# Patient Record
Sex: Female | Born: 1975
Health system: Southern US, Community
[De-identification: ages and names within clinical notes are randomized; demographics above are authoritative.]

## PROBLEM LIST (undated history)

## (undated) ENCOUNTER — Ambulatory Visit: Discharge status: Absent without leave | Payer: BLUE CROSS/BLUE SHIELD

## (undated) DIAGNOSIS — R519 Headache, unspecified: Secondary | ICD-10-CM

## (undated) DIAGNOSIS — Z8489 Family history of other specified conditions: Secondary | ICD-10-CM

## (undated) DIAGNOSIS — R51 Headache: Secondary | ICD-10-CM

## (undated) DIAGNOSIS — R112 Nausea with vomiting, unspecified: Secondary | ICD-10-CM

## (undated) DIAGNOSIS — D649 Anemia, unspecified: Secondary | ICD-10-CM

## (undated) DIAGNOSIS — Z9889 Other specified postprocedural states: Secondary | ICD-10-CM

## (undated) HISTORY — PX: ESSURE TUBAL LIGATION: SUR464

---

## 2001-09-03 ENCOUNTER — Observation Stay (HOSPITAL_COMMUNITY): Admission: AD | Admit: 2001-09-03 | Discharge: 2001-09-03 | Payer: Self-pay | Admitting: Obstetrics & Gynecology

## 2002-04-02 ENCOUNTER — Inpatient Hospital Stay (HOSPITAL_COMMUNITY): Admission: AD | Admit: 2002-04-02 | Discharge: 2002-04-04 | Payer: Self-pay | Admitting: Obstetrics and Gynecology

## 2002-06-07 ENCOUNTER — Ambulatory Visit (HOSPITAL_COMMUNITY): Admission: RE | Admit: 2002-06-07 | Discharge: 2002-06-07 | Payer: Self-pay | Admitting: *Deleted

## 2002-07-25 ENCOUNTER — Ambulatory Visit (HOSPITAL_COMMUNITY): Admission: RE | Admit: 2002-07-25 | Discharge: 2002-07-25 | Payer: Self-pay | Admitting: *Deleted

## 2003-01-10 ENCOUNTER — Encounter: Payer: Self-pay | Admitting: *Deleted

## 2003-01-10 ENCOUNTER — Ambulatory Visit (HOSPITAL_COMMUNITY): Admission: RE | Admit: 2003-01-10 | Discharge: 2003-01-10 | Payer: Self-pay | Admitting: Obstetrics and Gynecology

## 2005-06-03 ENCOUNTER — Emergency Department (HOSPITAL_COMMUNITY): Admission: EM | Admit: 2005-06-03 | Discharge: 2005-06-03 | Payer: Self-pay | Admitting: Emergency Medicine

## 2010-11-07 ENCOUNTER — Encounter: Payer: Self-pay | Admitting: Obstetrics and Gynecology

## 2011-03-04 NOTE — Op Note (Signed)
NAME:  Virginia Juarez, RABOLD                      ACCOUNT NO.:  [id number]   MEDICAL RECORD NO.:  [id number]                   PATIENT TYPE:  AMB   LOCATION:  SDC                                  FACILITY:  [facility]   PHYSICIAN:  [place] B. Earlene Plater, M.D.               DATE OF BIRTH:  1976/08/05   DATE OF PROCEDURE:  07/25/2002  DATE OF DISCHARGE:                                 OPERATIVE REPORT   PREOPERATIVE DIAGNOSES:  Desires tubal sterilization.   POSTOPERATIVE DIAGNOSES:  Desires tubal sterilization.   PROCEDURE:  Essure tubal sterilization.   SURGEON:  Chester Holstein. Earlene Plater, M.D.   ANESTHESIA:  General.   FINDINGS:  Atrophic endometrium, normal appearing tubal ostia.  After  placement of the Essure device and four coils visualized on the right and  three coils on the left this is considered optimal placement.   COMPLICATIONS:  None.   FLUID DEFICIT:  100 cc of normal saline.   INDICATIONS:  The patient desires tubal sterilization.  Previous  unsuccessful attempt with the Essure system due to poor visualization of  tubal ostia.  Therefore, patient was prepared today to undergo laparoscopic  tubal if we were unable to perform the procedure by the hysteroscopic route.  Therefore, she was placed under general anesthesia rather than a MAC.   PROCEDURE:  The patient was taken to the operating room and general  anesthesia obtained.  She was placed in the ski position and prepped and  draped in the standard fashion.  The bladder was emptied with rubber  catheter.  Speculum was inserted.  Cervix grasped with a tooth tenaculum and  the 5 mm 30 degree hysteroscope inserted after being flushed with saline.  Good uterine distention was noted and there was excellent suppression of the  endometrium.  Both tubal ostia were easily visualized.  The left tubal ostia  was first approached.  The Essure device was inserted, the guide wires  placed into the tubal ostia to the black marker per protocol.   The device  was then deployed in standard fashion.  Three coils were noted to emanate  from the tubal ostia after placement.  This was repeated on the opposite  side in the exact same manner.  Four coils were visualized after placement.  Again, this is considered optimal placement.   The patient tolerated procedure well and there were no complications.  She  was taken to the recovery room awake, alert, and in stable condition.   The patient is instructed to continue her Depo Provera for contraception  until the three month FDA mandated postoperative HSG.                                               Gerri Spore B. Earlene Plater, M.D.    WBD/MEDQ  D:  07/25/2002  T:  07/25/2002  Job:  [id number]

## 2011-03-04 NOTE — H&P (Signed)
   NAME:  Virginia Juarez, Virginia Juarez                      ACCOUNT NO.:  [id number]   MEDICAL RECORD NO.:  [id number]                   PATIENT TYPE:  AMB   LOCATION:  SDC                                  [facility]  [facility]   PHYSICIAN:  Gerri Spore B. Earlene Plater, M.D.               DATE OF BIRTH:  04-02-1976   DATE OF ADMISSION:  06/07/2002  DATE OF DISCHARGE:                                HISTORY & PHYSICAL   PREOPERATIVE DIAGNOSIS:  Desires permanent sterilization by Essure system.   POSTOPERATIVE DIAGNOSIS:  Desires permanent sterilization by Essure system.   HISTORY OF PRESENT ILLNESS:  The patient is a 35 year old African-American  female, gravida 3, para 2, A1, presents today for permanent tubal  sterilization.  Has considered alternative methods such as laparoscopic  tubal, IUD, Depo-Provera, or other non-permanent means.   PAST MEDICAL HISTORY:  Recurrent BEV.   PAST SURGICAL HISTORY:  None.   MEDICATIONS:  Depo-Provera.   ALLERGIES:  No known drug allergies.   SOCIAL HISTORY:  Occasional alcohol, no tobacco or drugs.   FAMILY HISTORY:  Hypertension.   REVIEW OF SYMPTOMS:  Otherwise noncontributory.   PHYSICAL EXAMINATION:  VITAL SIGNS:  Blood pressure 110/68, pulse 70, weight  140.  GENERAL:  Alert and oriented in no acute distress.  SKIN:  Warm and dry, no lesions.  HEART:  Regular rate and rhythm.  LUNGS:  Clear to auscultation.  ABDOMEN:  No evidence of hernia.  PELVIC:  Normal external genitalia.  Cervix normal.  Uterus is normal size,  nontender, no adnexal masses or tenderness.   ASSESSMENT:  Desires permanent sterilization with the Essure procedure.  Operation was discussed, including bleeding, uterine perforation, damage to  bowel, bladder, or surrounding organs.  Also discussed the 10 to 15% chance  of non-visualization or inability to cannulate both tubes.  Also discussed  the FDA mandated three month postoperative HSG to confirm tubal occlusion.  All questions answered,  the patient wishes to proceed.                                               Gerri Spore B. Earlene Plater, M.D.   WBD/MEDQ  D:  05/24/2002  T:  05/25/2002  Job:  [id number]

## 2011-03-04 NOTE — Op Note (Signed)
NAME:  Virginia Juarez, Virginia Juarez                      ACCOUNT NO.:  [id number]   MEDICAL RECORD NO.:  [id number]                   PATIENT TYPE:  AMB   LOCATION:  SDC                                  [facility]   PHYSICIAN:  Gerri Spore B. Earlene Plater, M.D.               DATE OF BIRTH:  06-07-1976   DATE OF PROCEDURE:  06/07/2002  DATE OF DISCHARGE:                                 OPERATIVE REPORT   PREOPERATIVE DIAGNOSIS:  Desires tubal sterilization by the Essure  procedure.   POSTOPERATIVE DIAGNOSIS:  Desires tubal sterilization by the Essure  procedure.   PROCEDURE:  Diagnostic hysteroscopy and attempted Essure insertion  (unsuccessful due to poor visualization of tubal ostia from excessive  endometrial tissue).   SURGEON:  Chester Holstein. Earlene Plater, M.D.   ANESTHESIA:  MAC and 10 cc of 0.75% Nesacaine paracervical block.   FINDINGS:  Shaggy endometrium with limited visualization of tubal ostia.   COMPLICATIONS:  None.   DISTENTION:  Medium saline deficit 709 cc.   INDICATIONS FOR PROCEDURE:  Desires permanent sterilization by the Essure  procedure.   PROCEDURE:  The patient was taken to the operating room and MAC anesthesia  obtained.  She was placed in the ski position and prepped and draped in a  standard fashion.  The bladder was emptied with a red rubber catheter.  The  patient was examined under anesthesia and found to have an anteverted,  normal size uterus.  No adnexal masses palpable.   The speculum was inserted, paracervical block placed, and the anterior lip  of cervix grasped with a single-tooth tenaculum.  A 5-mm hysteroscope was  inserted after being flushed and the cavity was distended was saline.  Shaggy endometrium was noted.  There was limited visualization of the tubal  ostia.  Multiple attempts were made to visualize the right tubal ostia as  the more difficult side.  It could be intermittently visualized; however,  when the working channel was open to insert the  Essure device, that slight  amount of loss of pressure would make visualization not possible.  As this  was the case, the device could not be safely inserted and the procedure was  abandoned.   The scope was removed, the tenaculum removed, and the cervix inspected.  It  was hemostatic.   The patient was taken to the recovery room awake but in stable condition.  The patient will have her postop visit in four weeks and we will discuss the  next step as far as sterilization.  She will have the option of a repeat  attempt.  With the Essure with better suppression of the endometrium, I feel  that she would be a good candidate.  Gerri Spore B. Earlene Plater, M.D.    WBD/MEDQ  D:  06/07/2002  T:  06/08/2002  Job:  [id number]

## 2011-03-04 NOTE — H&P (Signed)
   NAME:  Virginia Juarez, Virginia Juarez                      ACCOUNT NO.:  [id number]   MEDICAL RECORD NO.:  [id number]                   PATIENT TYPE:  AMB   LOCATION:  SDC                                  [facility]   PHYSICIAN:  [place] B. Earlene Plater, M.D.               DATE OF BIRTH:  [birth year]   DATE OF ADMISSION:  DATE OF DISCHARGE:                                HISTORY & PHYSICAL   PREOPERATIVE DIAGNOSIS:  Desires permanent sterilization.   POSTOPERATIVE DIAGNOSIS:  Desires permanent sterilization.   HISTORY OF PRESENT ILLNESS:  This is a 35 year old African-American female,  gravida 3, para 2, A1, presents today for definitive tubal sterilization.  She is interested in the Essure procedure.  We have undergone this once  before.  __________ , however, were unable to visualize the tubal osteal due  to inadequate suppression of the endometrium.  She presents today for repeat  procedure.  If there is any difficulty in visualization or canalization of  the tubes, we intend to proceed with a laparoscopic tubal.   PAST MEDICAL HISTORY:  Recurrent bacterial vaginosis.   PAST SURGICAL HISTORY:  None.   MEDICATIONS:  Depo-Provera.   ALLERGIES:  None.   SOCIAL HISTORY:  No alcohol, tobacco, or drugs.   FAMILY HISTORY:  Hypertension.   REVIEW OF SYSTEMS:  Otherwise negative.   PHYSICAL EXAMINATION:  VITAL SIGNS:  Blood pressure 110/68, pulse 70, weight  140.  GENERAL:  Oriented, no acute distress.  SKIN:  Warm and dry without lesions.  HEART:  Regular rate and rhythm.  LUNGS:  Clear to auscultation.  ABDOMEN:  No __________ without hernia.  PELVIC:  Normal external genitalia.  Vagina and cervix normal.  Bimanual  exam:  Normal sized uterus.  No adnexal mass or tenderness.   ASSESSMENT:  Desires permanent tubal sterilization.    PLAN:  Attempt at repeat Essure tubal sterilization.  If unable to complete,  will proceed with laparoscopic tubal.  Failure rate, ectopic risks, and  operative risks including infection, bleeding, damage to bowel or bladder,  or other organs, have all been discussed.  All questions were answered and  the patient wishes to proceed.                                                 Gerri Spore B. Earlene Plater, M.D.    WBD/MEDQ  D:  07/22/2002  T:  07/22/2002  Job:  [id number]

## 2012-01-03 ENCOUNTER — Emergency Department (HOSPITAL_COMMUNITY)
Admission: EM | Admit: 2012-01-03 | Discharge: 2012-01-04 | Disposition: A | Discharge status: Home / Self Care | Payer: BC Managed Care – PPO

## 2012-02-09 ENCOUNTER — Other Ambulatory Visit (HOSPITAL_COMMUNITY): Payer: Self-pay | Admitting: Nurse Practitioner

## 2012-02-09 DIAGNOSIS — Z139 Encounter for screening, unspecified: Secondary | ICD-10-CM

## 2012-02-10 ENCOUNTER — Ambulatory Visit (HOSPITAL_COMMUNITY)
Admission: RE | Admit: 2012-02-10 | Discharge: 2012-02-10 | Disposition: A | Discharge status: Home / Self Care | Payer: BC Managed Care – PPO | Source: Ambulatory Visit | Attending: Nurse Practitioner | Admitting: Nurse Practitioner

## 2012-02-10 DIAGNOSIS — Z1231 Encounter for screening mammogram for malignant neoplasm of breast: Secondary | ICD-10-CM | POA: Insufficient documentation

## 2012-02-10 DIAGNOSIS — Z139 Encounter for screening, unspecified: Secondary | ICD-10-CM

## 2014-08-13 ENCOUNTER — Other Ambulatory Visit (HOSPITAL_COMMUNITY): Payer: Self-pay | Admitting: Obstetrics and Gynecology

## 2014-08-13 DIAGNOSIS — Z1231 Encounter for screening mammogram for malignant neoplasm of breast: Secondary | ICD-10-CM

## 2014-09-05 ENCOUNTER — Ambulatory Visit (HOSPITAL_COMMUNITY): Payer: BC Managed Care – PPO | Attending: Obstetrics and Gynecology

## 2014-09-22 ENCOUNTER — Ambulatory Visit (HOSPITAL_COMMUNITY)
Admission: RE | Admit: 2014-09-22 | Discharge: 2014-09-22 | Disposition: A | Discharge status: Home / Self Care | Payer: BC Managed Care – PPO | Source: Ambulatory Visit | Attending: Obstetrics and Gynecology | Admitting: Obstetrics and Gynecology

## 2014-09-22 DIAGNOSIS — Z1231 Encounter for screening mammogram for malignant neoplasm of breast: Secondary | ICD-10-CM | POA: Insufficient documentation

## 2014-09-24 ENCOUNTER — Other Ambulatory Visit: Payer: Self-pay | Admitting: Obstetrics and Gynecology

## 2014-09-24 DIAGNOSIS — R928 Other abnormal and inconclusive findings on diagnostic imaging of breast: Secondary | ICD-10-CM

## 2014-10-07 ENCOUNTER — Ambulatory Visit (HOSPITAL_COMMUNITY)
Admission: RE | Admit: 2014-10-07 | Discharge: 2014-10-07 | Disposition: A | Discharge status: Home / Self Care | Payer: BC Managed Care – PPO | Source: Ambulatory Visit | Attending: Obstetrics and Gynecology | Admitting: Obstetrics and Gynecology

## 2014-10-07 DIAGNOSIS — Z1231 Encounter for screening mammogram for malignant neoplasm of breast: Secondary | ICD-10-CM | POA: Diagnosis not present

## 2014-10-07 DIAGNOSIS — R928 Other abnormal and inconclusive findings on diagnostic imaging of breast: Secondary | ICD-10-CM

## 2015-09-15 ENCOUNTER — Other Ambulatory Visit (HOSPITAL_COMMUNITY): Payer: Self-pay | Admitting: Nurse Practitioner

## 2015-09-15 DIAGNOSIS — Z1231 Encounter for screening mammogram for malignant neoplasm of breast: Secondary | ICD-10-CM

## 2015-10-14 ENCOUNTER — Ambulatory Visit (HOSPITAL_COMMUNITY)
Admission: RE | Admit: 2015-10-14 | Discharge: 2015-10-14 | Disposition: A | Discharge status: Home / Self Care | Payer: BLUE CROSS/BLUE SHIELD | Source: Ambulatory Visit | Attending: Nurse Practitioner | Admitting: Nurse Practitioner

## 2015-10-14 DIAGNOSIS — Z1231 Encounter for screening mammogram for malignant neoplasm of breast: Secondary | ICD-10-CM | POA: Diagnosis present

## 2016-07-14 ENCOUNTER — Other Ambulatory Visit: Payer: Self-pay | Admitting: Obstetrics and Gynecology

## 2016-07-28 NOTE — Patient Instructions (Addendum)
Your procedure is scheduled on:  Wednesday, Oct. 18, 2017  Enter through the [employer] [facility] at:  7:00 AM  Pick up the phone at the desk and dial [phone].  Call this number if you have problems the morning of surgery: [phone].  Remember: Do NOT eat food or drink after:  Midnight Tuesday  Take these medicines the morning of surgery with a SIP OF WATER:  None  Do NOT wear jewelry (body piercing), metal hair clips/bobby pins, make-up, or nail polish. Do NOT wear lotions, powders, or perfumes.  You may wear deodorant. Do NOT shave for 48 hours prior to surgery. Do NOT bring valuables to the hospital. Contacts, dentures, or bridgework may not be worn into surgery.  Have a responsible adult drive you home and stay with you for 24 hours after your procedure

## 2016-08-01 ENCOUNTER — Encounter (HOSPITAL_COMMUNITY)
Admission: RE | Admit: 2016-08-01 | Discharge: 2016-08-01 | Disposition: A | Discharge status: Home / Self Care | Payer: BLUE CROSS/BLUE SHIELD | Source: Ambulatory Visit | Attending: Obstetrics and Gynecology | Admitting: Obstetrics and Gynecology

## 2016-08-01 ENCOUNTER — Encounter (HOSPITAL_COMMUNITY): Payer: Self-pay

## 2016-08-01 DIAGNOSIS — N920 Excessive and frequent menstruation with regular cycle: Secondary | ICD-10-CM | POA: Diagnosis present

## 2016-08-01 DIAGNOSIS — N840 Polyp of corpus uteri: Secondary | ICD-10-CM | POA: Diagnosis not present

## 2016-08-01 HISTORY — DX: Anemia, unspecified: D64.9

## 2016-08-01 HISTORY — DX: Headache, unspecified: R51.9

## 2016-08-01 HISTORY — DX: Other specified postprocedural states: Z98.890

## 2016-08-01 HISTORY — DX: Nausea with vomiting, unspecified: R11.2

## 2016-08-01 HISTORY — DX: Headache: R51

## 2016-08-01 LAB — CBC
HCT: 32.3 % — ABNORMAL LOW (ref 36.0–46.0)
Hemoglobin: 11.6 g/dL — ABNORMAL LOW (ref 12.0–15.0)
MCH: 31.8 pg (ref 26.0–34.0)
MCHC: 35.9 g/dL (ref 30.0–36.0)
MCV: 88.5 fL (ref 78.0–100.0)
Platelets: 314 10*3/uL (ref 150–400)
RBC: 3.65 MIL/uL — ABNORMAL LOW (ref 3.87–5.11)
RDW: 13.7 % (ref 11.5–15.5)
WBC: 5.3 10*3/uL (ref 4.0–10.5)

## 2016-08-03 ENCOUNTER — Ambulatory Visit (HOSPITAL_COMMUNITY)
Admission: RE | Admit: 2016-08-03 | Discharge: 2016-08-03 | Disposition: A | Discharge status: Home / Self Care | Payer: BLUE CROSS/BLUE SHIELD | Source: Ambulatory Visit | Attending: Obstetrics and Gynecology | Admitting: Obstetrics and Gynecology

## 2016-08-03 ENCOUNTER — Ambulatory Visit (HOSPITAL_COMMUNITY): Payer: BLUE CROSS/BLUE SHIELD | Admitting: Anesthesiology

## 2016-08-03 ENCOUNTER — Encounter (HOSPITAL_COMMUNITY)
Admission: RE | Disposition: A | Discharge status: Home / Self Care | Payer: Self-pay | Source: Ambulatory Visit | Attending: Obstetrics and Gynecology

## 2016-08-03 ENCOUNTER — Encounter (HOSPITAL_COMMUNITY): Payer: Self-pay | Admitting: Emergency Medicine

## 2016-08-03 DIAGNOSIS — N840 Polyp of corpus uteri: Secondary | ICD-10-CM | POA: Insufficient documentation

## 2016-08-03 DIAGNOSIS — N920 Excessive and frequent menstruation with regular cycle: Secondary | ICD-10-CM

## 2016-08-03 HISTORY — PX: DILATATION & CURETTAGE/HYSTEROSCOPY WITH MYOSURE: SHX6511

## 2016-08-03 HISTORY — DX: Family history of other specified conditions: Z84.89

## 2016-08-03 LAB — PREGNANCY, URINE: Preg Test, Ur: NEGATIVE

## 2016-08-03 SURGERY — DILATATION & CURETTAGE/HYSTEROSCOPY WITH MYOSURE
Anesthesia: General | Site: Uterus

## 2016-08-03 MED ORDER — MIDAZOLAM HCL 2 MG/2ML IJ SOLN
INTRAMUSCULAR | Status: AC
  Filled 2016-08-03: qty 2

## 2016-08-03 MED ORDER — DEXAMETHASONE SODIUM PHOSPHATE 10 MG/ML IJ SOLN
INTRAMUSCULAR | Status: AC
  Filled 2016-08-03: qty 1

## 2016-08-03 MED ORDER — LIDOCAINE HCL (CARDIAC) 20 MG/ML IV SOLN
INTRAVENOUS | Status: AC
  Filled 2016-08-03: qty 5

## 2016-08-03 MED ORDER — FENTANYL CITRATE (PF) 100 MCG/2ML IJ SOLN
INTRAMUSCULAR | Status: AC
  Filled 2016-08-03: qty 2

## 2016-08-03 MED ORDER — SCOPOLAMINE 1 MG/3DAYS TD PT72
MEDICATED_PATCH | TRANSDERMAL | Status: AC
  Administered 2016-08-03: 1.5 mg via TRANSDERMAL
  Filled 2016-08-03: qty 1

## 2016-08-03 MED ORDER — IBUPROFEN 800 MG PO TABS: 800.0000 mg | ORAL_TABLET | Freq: Three times a day (TID) | ORAL | 0 refills | Status: DC | PRN

## 2016-08-03 MED ORDER — SCOPOLAMINE 1 MG/3DAYS TD PT72
1.0000 | MEDICATED_PATCH | Freq: Once | TRANSDERMAL | Status: DC
  Administered 2016-08-03: 1.5 mg via TRANSDERMAL

## 2016-08-03 MED ORDER — DEXAMETHASONE SODIUM PHOSPHATE 4 MG/ML IJ SOLN
INTRAMUSCULAR | Status: DC | PRN
  Administered 2016-08-03: 10 mg via INTRAVENOUS

## 2016-08-03 MED ORDER — ONDANSETRON HCL 4 MG/2ML IJ SOLN
INTRAMUSCULAR | Status: AC
  Filled 2016-08-03: qty 2

## 2016-08-03 MED ORDER — KETOROLAC TROMETHAMINE 60 MG/2ML IM SOLN
INTRAMUSCULAR | Status: DC | PRN
  Administered 2016-08-03: 30 mg via INTRAMUSCULAR

## 2016-08-03 MED ORDER — FENTANYL CITRATE (PF) 100 MCG/2ML IJ SOLN: 25.0000 ug | INTRAMUSCULAR | Status: DC | PRN

## 2016-08-03 MED ORDER — SCOPOLAMINE 1 MG/3DAYS TD PT72
MEDICATED_PATCH | TRANSDERMAL | Status: AC
  Filled 2016-08-03: qty 1

## 2016-08-03 MED ORDER — LIDOCAINE HCL (CARDIAC) 20 MG/ML IV SOLN
INTRAVENOUS | Status: DC | PRN
  Administered 2016-08-03: 100 mg via INTRAVENOUS

## 2016-08-03 MED ORDER — LACTATED RINGERS IV SOLN
INTRAVENOUS | Status: DC
  Administered 2016-08-03 (×2): via INTRAVENOUS

## 2016-08-03 MED ORDER — MIDAZOLAM HCL 5 MG/5ML IJ SOLN
INTRAMUSCULAR | Status: DC | PRN
  Administered 2016-08-03: 2 mg via INTRAVENOUS

## 2016-08-03 MED ORDER — MEPERIDINE HCL 25 MG/ML IJ SOLN: 6.2500 mg | INTRAMUSCULAR | Status: DC | PRN

## 2016-08-03 MED ORDER — PROPOFOL 10 MG/ML IV BOLUS
INTRAVENOUS | Status: AC
  Filled 2016-08-03: qty 20

## 2016-08-03 MED ORDER — METOCLOPRAMIDE HCL 5 MG/ML IJ SOLN: 10.0000 mg | Freq: Once | INTRAMUSCULAR | Status: DC | PRN

## 2016-08-03 MED ORDER — KETOROLAC TROMETHAMINE 30 MG/ML IJ SOLN
INTRAMUSCULAR | Status: AC
  Filled 2016-08-03: qty 2

## 2016-08-03 MED ORDER — PROPOFOL 10 MG/ML IV BOLUS
INTRAVENOUS | Status: DC | PRN
  Administered 2016-08-03: 200 mg via INTRAVENOUS

## 2016-08-03 MED ORDER — KETOROLAC TROMETHAMINE 30 MG/ML IJ SOLN
INTRAMUSCULAR | Status: DC | PRN
  Administered 2016-08-03: 30 mg via INTRAVENOUS

## 2016-08-03 MED ORDER — FENTANYL CITRATE (PF) 100 MCG/2ML IJ SOLN
INTRAMUSCULAR | Status: DC | PRN
  Administered 2016-08-03 (×2): 50 ug via INTRAVENOUS

## 2016-08-03 MED ORDER — ONDANSETRON HCL 4 MG/2ML IJ SOLN
INTRAMUSCULAR | Status: DC | PRN
  Administered 2016-08-03: 4 mg via INTRAVENOUS

## 2016-08-03 MED ORDER — LACTATED RINGERS IV SOLN: INTRAVENOUS | Status: DC

## 2016-08-03 SURGICAL SUPPLY — 20 items
CANISTER SUCT 3000ML (MISCELLANEOUS) ×2 IMPLANT
CATH ROBINSON RED A/P 16FR (CATHETERS) ×2 IMPLANT
CLOTH BEACON ORANGE TIMEOUT ST (SAFETY) ×2 IMPLANT
CONTAINER PREFILL 10% NBF 60ML (FORM) ×4 IMPLANT
DEVICE MYOSURE LITE (MISCELLANEOUS) ×1 IMPLANT
DEVICE MYOSURE REACH (MISCELLANEOUS) IMPLANT
ELECT REM PT RETURN 9FT ADLT (ELECTROSURGICAL) ×2
ELECTRODE REM PT RTRN 9FT ADLT (ELECTROSURGICAL) ×1 IMPLANT
FILTER ARTHROSCOPY CONVERTOR (FILTER) ×2 IMPLANT
GLOVE BIOGEL PI IND STRL 7.0 (GLOVE) ×2 IMPLANT
GLOVE BIOGEL PI INDICATOR 7.0 (GLOVE) ×2
GLOVE ECLIPSE 6.5 STRL STRAW (GLOVE) ×2 IMPLANT
GOWN STRL REUS W/TWL LRG LVL3 (GOWN DISPOSABLE) ×4 IMPLANT
PACK VAGINAL MINOR WOMEN LF (CUSTOM PROCEDURE TRAY) ×2 IMPLANT
PAD OB MATERNITY 4.3X12.25 (PERSONAL CARE ITEMS) ×2 IMPLANT
SEAL ROD LENS SCOPE MYOSURE (ABLATOR) ×2 IMPLANT
TOWEL OR 17X24 6PK STRL BLUE (TOWEL DISPOSABLE) ×4 IMPLANT
TUBING AQUILEX INFLOW (TUBING) ×2 IMPLANT
TUBING AQUILEX OUTFLOW (TUBING) ×2 IMPLANT
WATER STERILE IRR 1000ML POUR (IV SOLUTION) ×2 IMPLANT

## 2016-08-03 NOTE — Discharge Instructions (Signed)
CALL  IF TEMP>100.4, NOTHING PER VAGINA X 1 WK, CALL IF SOAKING A MAXI  PAD EVERY HOUR OR MORE FREQUENTLY  DISCHARGE INSTRUCTIONS: HYSTEROSCOPY The following instructions have been prepared to help you care for yourself upon your return home.  May Remove Scop patch on or before Saturday 08/06/16. Wash your hands with soap and water after any contact with the patch.  May take Ibuprofen after 2:30pm 08/03/16.  May take stool softner while taking narcotic pain medication to prevent constipation.  Drink plenty of water.  Personal hygiene:  Use sanitary pads for vaginal drainage, not tampons.  Shower the day after your procedure.  NO tub baths, pools or Jacuzzis for 2-3 weeks.  Wipe front to back after using the bathroom.  Activity and limitations:  Do NOT drive or operate any equipment for 24 hours. The effects of anesthesia are still present and drowsiness may result.  Do NOT rest in bed all day.  Walking is encouraged.  Walk up and down stairs slowly.  You may resume your normal activity in one to two days or as indicated by your physician. Sexual activity: NO intercourse for at least 1 week after the procedure  Diet: Eat a light meal as desired this evening. You may resume your usual diet tomorrow.  Return to Work: You may resume your work activities in one to two days or as indicated by Marine scientist.  What to expect after your surgery: Expect to have vaginal bleeding/discharge for 2-3 days and spotting for up to 10 days. It is not unusual to have soreness for up to 1-2 weeks. You may have a slight burning sensation when you urinate for the first day. Mild cramps may continue for a couple of days. You may have a regular period in 2-6 weeks.  Call your doctor for any of the following:  Excessive vaginal bleeding or clotting, saturating and changing one pad every hour.  Inability to urinate 6 hours after discharge from hospital.  Pain not relieved by pain medication.   Fever of 100.4 F or greater.  Unusual vaginal discharge or odor.  Return to office _________________Call for an appointment ___________________ Patients signature: ______________________ Nurses signature ________________________  [facility] [phone]

## 2016-08-03 NOTE — Anesthesia Postprocedure Evaluation (Addendum)
Anesthesia Post Note  Patient: Virginia Juarez  Procedure(s) Performed: Procedure(s) (LRB): DILATATION & CURETTAGE/HYSTEROSCOPY WITH MYOSURE (N/A)  Patient location during evaluation: PACU Anesthesia Type: General Level of consciousness: awake Pain management: pain level controlled Vital Signs Assessment: post-procedure vital signs reviewed and stable Cardiovascular status: stable Postop Assessment: no signs of nausea or vomiting Anesthetic complications: no     Last Vitals:  Vitals:   08/03/16 0915 08/03/16 0930  BP: 107/72 110/77  Pulse: (!) 55 (!) 59  Resp: 13 14  Temp:      Last Pain:  Vitals:   08/03/16 0915  TempSrc:   PainSc: 2    Pain Goal: Patients Stated Pain Goal: 3 (08/03/16 0915)               [place]

## 2016-08-03 NOTE — Transfer of Care (Signed)
Immediate Anesthesia Transfer of Care Note  Patient: Virginia Juarez  Procedure(s) Performed: Procedure(s): DILATATION & CURETTAGE/HYSTEROSCOPY WITH MYOSURE (N/A)  Patient Location: PACU  Anesthesia Type:General  Level of Consciousness: sedated  Airway & Oxygen Therapy: Patient Spontanous Breathing and Patient connected to nasal cannula oxygen  Post-op Assessment: Report given to RN and Post -op Vital signs reviewed and stable  Post vital signs: Reviewed and stable  Last Vitals:  Vitals:   08/03/16 0711  BP: 116/78  Pulse: 72  Resp: 16  Temp: 36.6 C    Last Pain:  Vitals:   08/03/16 0711  TempSrc: Oral      Patients Stated Pain Goal: 3 ([id number])  Complications: No apparent anesthesia complications

## 2016-08-03 NOTE — Anesthesia Preprocedure Evaluation (Signed)
Anesthesia Evaluation  Patient identified by MRN, date of birth, ID band Patient awake    Reviewed: Allergy & Precautions, NPO status , Patient's Chart, lab work & pertinent test results  History of Anesthesia Complications (+) PONV  Airway Mallampati: II  TM Distance: >3 FB Neck ROM: Full    Dental no notable dental hx.    Pulmonary neg pulmonary ROS,    Pulmonary exam normal breath sounds clear to auscultation       Cardiovascular negative cardio ROS Normal cardiovascular exam Rhythm:Regular Rate:Normal     Neuro/Psych negative neurological ROS  negative psych ROS   GI/Hepatic negative GI ROS, Neg liver ROS,   Endo/Other  negative endocrine ROS  Renal/GU negative Renal ROS  negative genitourinary   Musculoskeletal negative musculoskeletal ROS (+)   Abdominal   Peds negative pediatric ROS (+)  Hematology negative hematology ROS (+)   Anesthesia Other Findings   Reproductive/Obstetrics negative OB ROS                             Anesthesia Physical Anesthesia Plan  ASA: II  Anesthesia Plan: General   Post-op Pain Management:    Induction: Intravenous  Airway Management Planned: LMA  Additional Equipment:   Intra-op Plan:   Post-operative Plan: Extubation in OR  Informed Consent: I have reviewed the patients History and Physical, chart, labs and discussed the procedure including the risks, benefits and alternatives for the proposed anesthesia with the patient or authorized representative who has indicated his/her understanding and acceptance.   Dental advisory given  Plan Discussed with: CRNA  Anesthesia Plan Comments:         Anesthesia Quick Evaluation  

## 2016-08-03 NOTE — Brief Op Note (Signed)
08/03/2016  8:50 AM  PATIENT:  Virginia Juarez  40 y.o. female  PRE-OPERATIVE DIAGNOSIS:  Menorrhagia with regular cycle, Endometrial mass,   POST-OPERATIVE DIAGNOSIS:  MENORRHAGIA with regular cycles, endometrial polyp  PROCEDURE:  Diagnostic hysteroscopy, hysteroscopic resection of endometrial polyp, D&C  SURGEON:  Surgeon(s) and Role:    * Servando Salina, MD - Primary  PHYSICIAN ASSISTANT:   ASSISTANTS: none   ANESTHESIA:   general Findings: posterior endom polyp, tubal ostia seen but no coils noted nl endocervical canal EBL:  Total I/O In: 1200 [I.V.:1200] Out: 105 [Urine:100; Blood:5]  BLOOD ADMINISTERED:none  DRAINS: none   LOCAL MEDICATIONS USED:  NONE  SPECIMEN:  Source of Specimen:  emc with polyp  DISPOSITION OF SPECIMEN:  PATHOLOGY  COUNTS:  YES  TOURNIQUET:  * No tourniquets in log *  DICTATION: .Other Dictation: Dictation Number [id number]  PLAN OF CARE: Discharge to home after PACU  PATIENT DISPOSITION:  PACU - hemodynamically stable.   Delay start of Pharmacological VTE agent (>24hrs) due to surgical blood loss or risk of bleeding: no

## 2016-08-03 NOTE — Anesthesia Procedure Notes (Signed)
Procedure Name: LMA Insertion Date/Time: 08/03/2016 8:22 AM Performed by: Riki Sheer Pre-anesthesia Checklist: Patient identified, Emergency Drugs available, Suction available, Patient being monitored and Timeout performed Patient Re-evaluated:Patient Re-evaluated prior to inductionOxygen Delivery Method: Circle system utilized Preoxygenation: Pre-oxygenation with 100% oxygen Intubation Type: IV induction Ventilation: Mask ventilation without difficulty LMA: LMA inserted LMA Size: 4.0 Number of attempts: 1 Placement Confirmation: positive ETCO2,  CO2 detector and breath sounds checked- equal and bilateral Tube secured with: Tape Dental Injury: Teeth and Oropharynx as per pre-operative assessment

## 2016-08-04 ENCOUNTER — Encounter (HOSPITAL_COMMUNITY): Payer: Self-pay | Admitting: Obstetrics and Gynecology

## 2016-08-04 NOTE — Op Note (Signed)
[name], [name]             ACCOUNT NO.:  [id number]  MEDICAL RECORD NO.:  [id number]  LOCATION:                                FACILITY:  [facility]  PHYSICIAN:  Servando Salina, M.D.DATE OF BIRTH:  February 01, 1976  DATE OF PROCEDURE:  08/03/2016 DATE OF DISCHARGE:                              OPERATIVE REPORT   PREOPERATIVE DIAGNOSIS:  Menorrhagia with regular cycles, endometrial mass.  PROCEDURE:  Diagnostic hysteroscopy, hysteroscopic resection of endometrial polyp, dilation and curettage.  POSTOPERATIVE DIAGNOSES:  Endometrial polyp.  Menorrhagia with regular cycle.  ANESTHESIA:  General.  ASSISTANT:  None.  DESCRIPTION OF PROCEDURE:  Under adequate general anesthesia, the patient was placed in the dorsal lithotomy position.  She was sterilely prepped and draped in usual fashion, indwelling Foley catheter.  The bladder was catheterized with small amount of urine.  Examination under anesthesia revealed an anteflexed uterus.  No adnexal masses could be appreciated.  Bivalve speculum was placed in the vagina.  Single-tooth tenaculum was placed on anterior lip of the cervix.  The cervix easily accepted a #19 Pratt dilator.  MyoSure hysteroscope was introduced into the uterine cavity without incident.  Both tubal ostias could be seen. The end of the each Essure coils were not seen bilaterally.  Posteriorly, there was a polypoid mass.  Using the Lite classic MyoSure resectoscope resection apparatus, the polyp was resected as was the endometrium.  At that point, the endocervical canal was inspected.  No lesions noted.  Subsequently, all instruments were then removed from the vagina.  SPECIMEN:  Labeled.  Endometrial curetting with polyps was sent to Pathology.  ESTIMATED BLOOD LOSS:  Minimal.  COMPLICATION:  None.  The patient tolerated the procedure well and was transferred to recovery in stable condition.    Servando Salina,  M.D.   ______________________________ Servando Salina, M.D.   [place]/MEDQ  D:  08/03/2016  T:  08/04/2016  Job:  [id number]

## 2016-08-04 NOTE — Op Note (Deleted)
  The note originally documented on this encounter has been moved the the encounter in which it belongs.  

## 2018-04-24 ENCOUNTER — Other Ambulatory Visit (HOSPITAL_COMMUNITY): Payer: Self-pay | Admitting: Family Medicine

## 2018-04-24 DIAGNOSIS — Z1231 Encounter for screening mammogram for malignant neoplasm of breast: Secondary | ICD-10-CM

## 2018-05-03 ENCOUNTER — Ambulatory Visit (HOSPITAL_COMMUNITY): Payer: BLUE CROSS/BLUE SHIELD

## 2018-05-07 ENCOUNTER — Ambulatory Visit (HOSPITAL_COMMUNITY)
Admission: RE | Admit: 2018-05-07 | Discharge: 2018-05-07 | Disposition: A | Discharge status: Home / Self Care | Payer: BLUE CROSS/BLUE SHIELD | Source: Ambulatory Visit | Attending: Family Medicine | Admitting: Family Medicine

## 2018-05-07 ENCOUNTER — Encounter (HOSPITAL_COMMUNITY): Payer: Self-pay

## 2018-05-07 DIAGNOSIS — Z1231 Encounter for screening mammogram for malignant neoplasm of breast: Secondary | ICD-10-CM | POA: Insufficient documentation

## 2019-09-25 ENCOUNTER — Other Ambulatory Visit: Payer: Self-pay | Admitting: Family Medicine

## 2019-09-25 DIAGNOSIS — Z1231 Encounter for screening mammogram for malignant neoplasm of breast: Secondary | ICD-10-CM

## 2020-08-13 ENCOUNTER — Other Ambulatory Visit: Payer: Self-pay | Admitting: Family Medicine

## 2020-08-13 DIAGNOSIS — Z1231 Encounter for screening mammogram for malignant neoplasm of breast: Secondary | ICD-10-CM

## 2020-11-06 ENCOUNTER — Other Ambulatory Visit: Payer: Self-pay

## 2020-11-06 ENCOUNTER — Ambulatory Visit (HOSPITAL_COMMUNITY)
Admission: RE | Admit: 2020-11-06 | Discharge: 2020-11-06 | Disposition: A | Discharge status: Home / Self Care | Payer: BLUE CROSS/BLUE SHIELD | Source: Ambulatory Visit | Attending: Family Medicine | Admitting: Family Medicine

## 2020-11-06 DIAGNOSIS — Z1231 Encounter for screening mammogram for malignant neoplasm of breast: Secondary | ICD-10-CM | POA: Diagnosis not present

## 2020-11-11 ENCOUNTER — Other Ambulatory Visit (HOSPITAL_COMMUNITY): Payer: Self-pay | Admitting: Family Medicine

## 2020-11-11 DIAGNOSIS — R928 Other abnormal and inconclusive findings on diagnostic imaging of breast: Secondary | ICD-10-CM

## 2020-11-17 ENCOUNTER — Ambulatory Visit (HOSPITAL_COMMUNITY)
Admission: RE | Admit: 2020-11-17 | Discharge: 2020-11-17 | Disposition: A | Discharge status: Home / Self Care | Payer: BLUE CROSS/BLUE SHIELD | Source: Ambulatory Visit | Attending: Family Medicine | Admitting: Family Medicine

## 2020-11-17 ENCOUNTER — Other Ambulatory Visit: Payer: Self-pay

## 2020-11-17 DIAGNOSIS — R928 Other abnormal and inconclusive findings on diagnostic imaging of breast: Secondary | ICD-10-CM | POA: Insufficient documentation

## 2021-06-16 ENCOUNTER — Other Ambulatory Visit (HOSPITAL_COMMUNITY): Payer: Self-pay | Admitting: Family Medicine

## 2021-06-16 ENCOUNTER — Other Ambulatory Visit: Payer: Self-pay | Admitting: Family Medicine

## 2021-06-16 DIAGNOSIS — R102 Pelvic and perineal pain: Secondary | ICD-10-CM

## 2021-06-16 DIAGNOSIS — D219 Benign neoplasm of connective and other soft tissue, unspecified: Secondary | ICD-10-CM

## 2021-06-16 DIAGNOSIS — N938 Other specified abnormal uterine and vaginal bleeding: Secondary | ICD-10-CM

## 2021-07-01 ENCOUNTER — Ambulatory Visit: Payer: BLUE CROSS/BLUE SHIELD | Attending: Family Medicine

## 2021-10-20 IMAGING — MG MM DIGITAL DIAGNOSTIC UNILAT*L* W/ TOMO W/ CAD
5 series · 6 of 17 positions shown · non-contrast
Comparison: Previous exams.

CLINICAL DATA: Screening recall for left breast masses.

EXAM:
DIGITAL DIAGNOSTIC UNILATERAL LEFT MAMMOGRAM WITH TOMO AND CAD;
ULTRASOUND LEFT BREAST LIMITED
TECHNIQUE: Left digital diagnostic mammography and breast tomosynthesis was
performed. Digital images of the breasts were evaluated with
computer-aided detection.

[L CC synth-2D (1 of 2)]
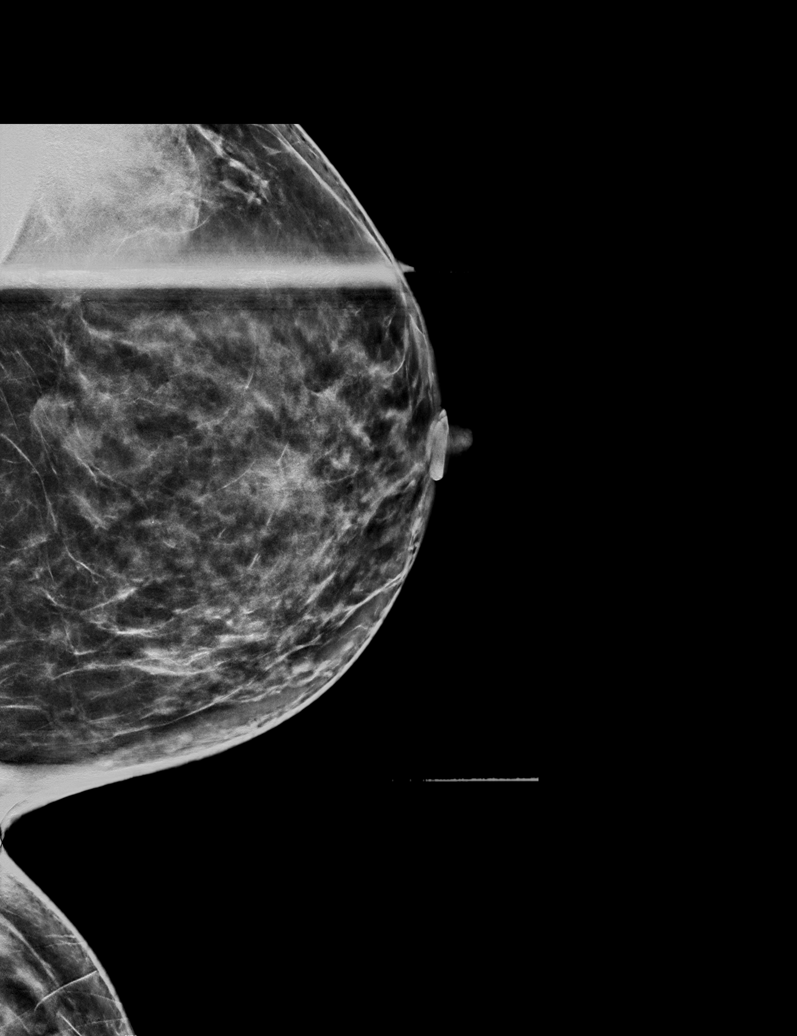

[L CC synth-2D (2 of 2)]
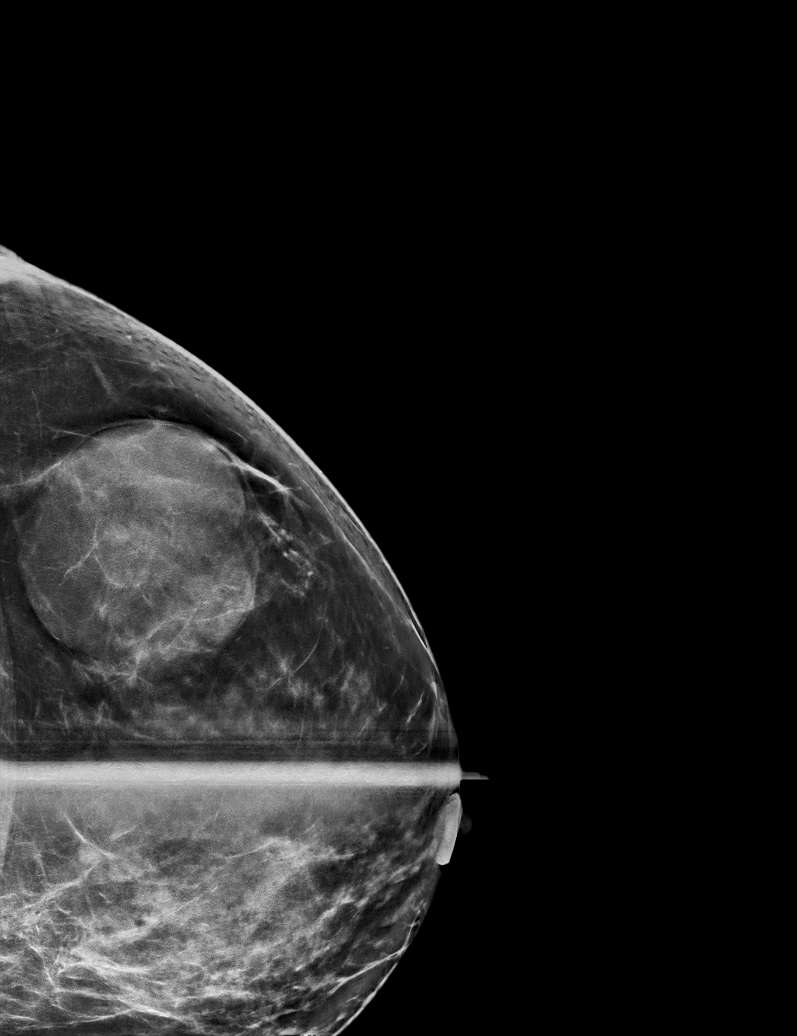

[L CC tomo · 2 of 77 frames shown (1 of 2)]
[frame 25/77]
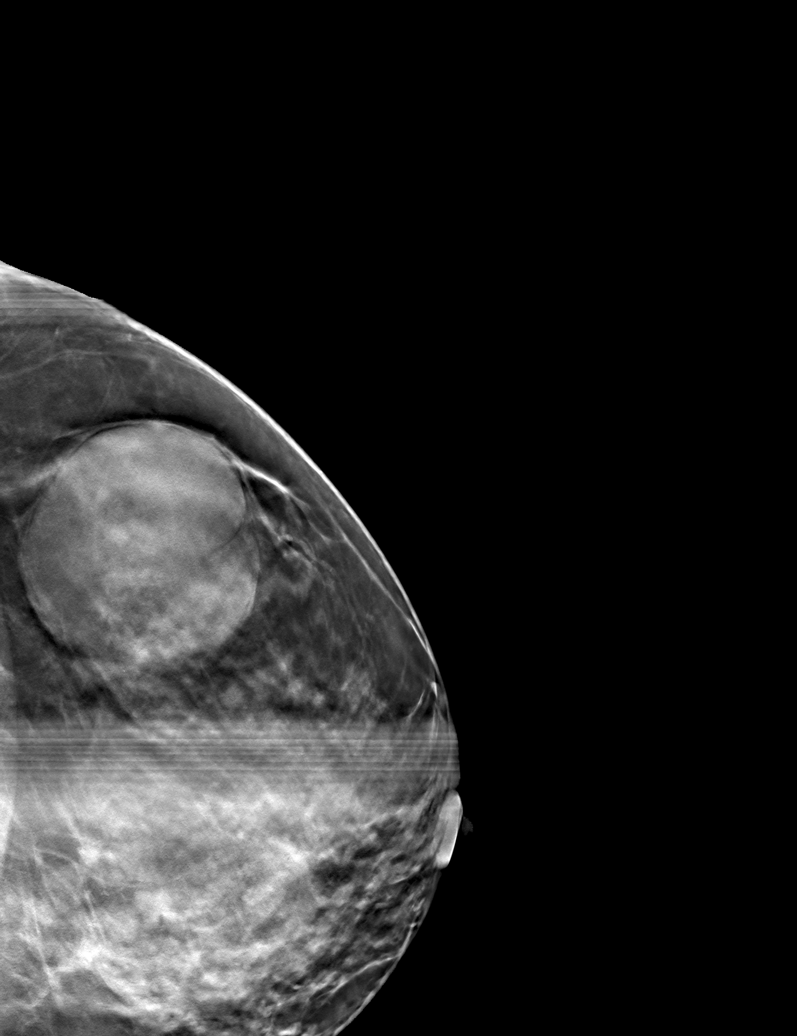
[frame 39/77]
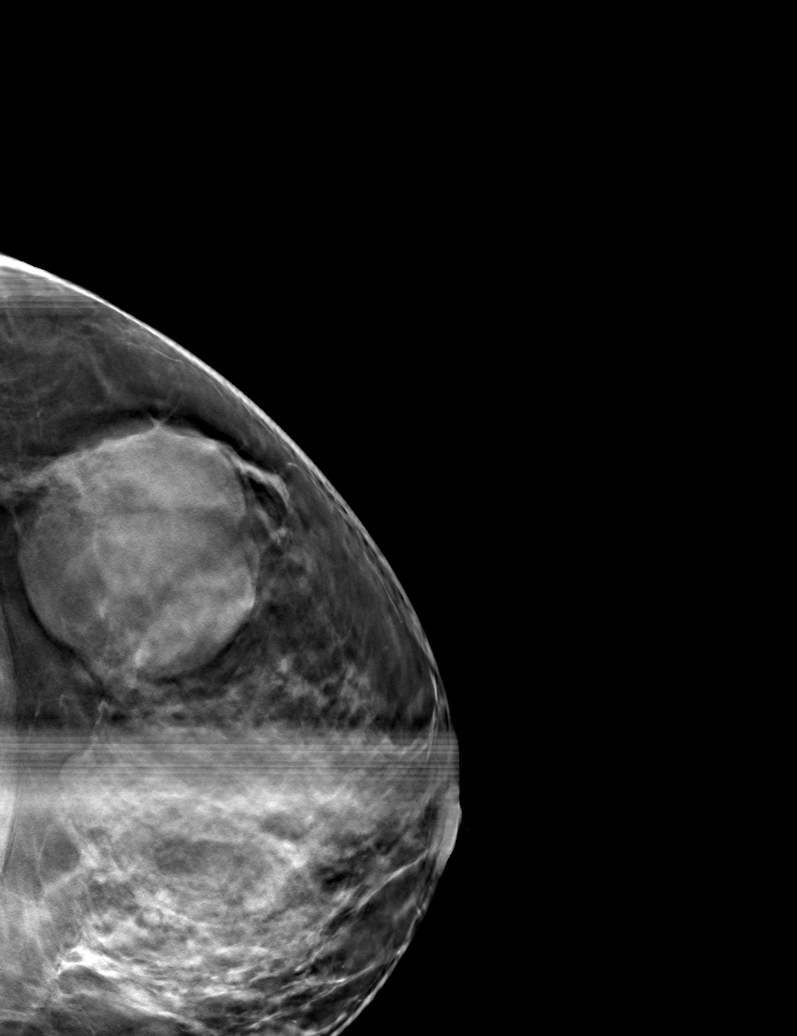

[L CC tomo (2 of 2) · tomo slice 37/72.0]
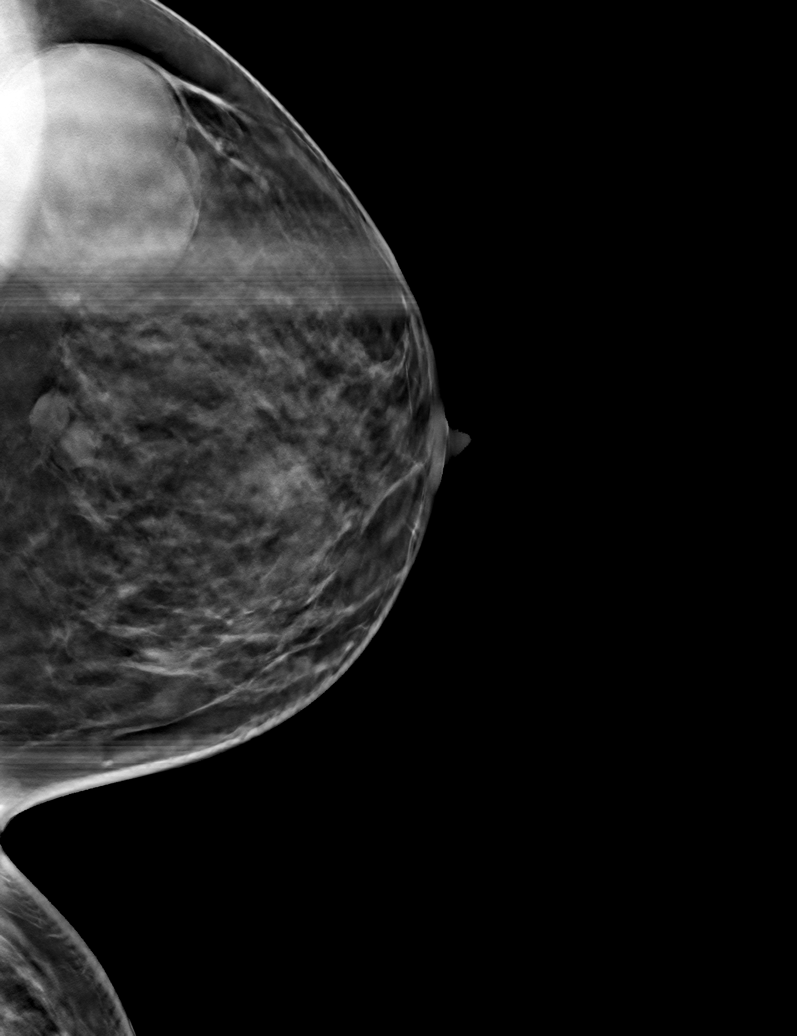

[L MLO tomo · tomo slice 39/77.0]
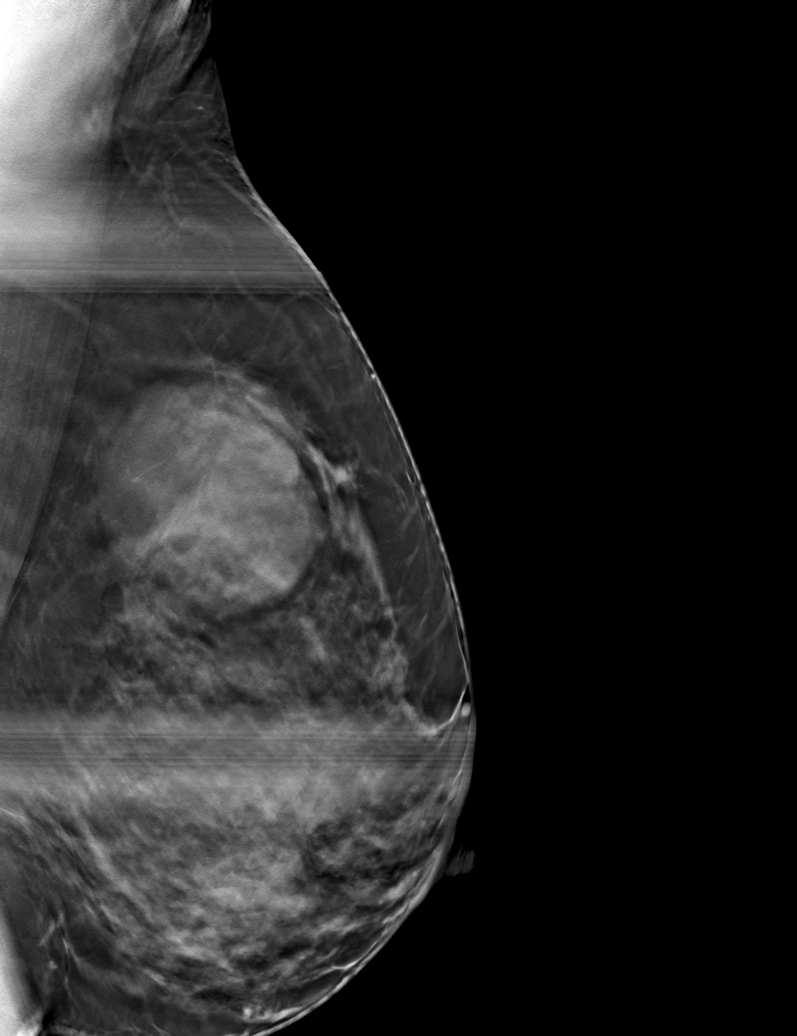

[6 of 17 positions shown; findings below may reference images not displayed]

ACR Breast Density Category c: The breast tissue is heterogeneously
dense, which may obscure small masses.
FINDINGS: Spot compression tomograms were performed of the left breast. There
is an oval oval circumscribed mass in the upper-outer left breast
measuring approximately 5 cm. There are 2 adjacent oval
circumscribed masses in upper central left breast together measuring
1.7 cm.

Targeted ultrasound of the superior left breast was performed
demonstrating several cysts. A large cyst at 1 o'clock 4 cm from
nipple measures 5.3 x 2 x 4.6 cm. Additional bilobed cyst versus 2
adjacent cysts at 1 o'clock 4 cm from nipple measures 4 x 1.7 x 3
cm. These correspond well with the masses seen in the left breast at
mammography. No suspicious masses or abnormality seen in the left
breast.
IMPRESSION: No findings of malignancy in the left breast.

RECOMMENDATION:
Screening mammogram in one year.(Code:BB-N-4J5)

I have discussed the findings and recommendations with the patient.
If applicable, a reminder letter will be sent to the patient
regarding the next appointment.

BI-RADS CATEGORY  2: Benign.

## 2021-12-21 ENCOUNTER — Other Ambulatory Visit (HOSPITAL_COMMUNITY): Payer: Self-pay | Admitting: Family Medicine

## 2021-12-21 DIAGNOSIS — Z1231 Encounter for screening mammogram for malignant neoplasm of breast: Secondary | ICD-10-CM

## 2022-01-10 ENCOUNTER — Ambulatory Visit (HOSPITAL_COMMUNITY)
Admission: RE | Admit: 2022-01-10 | Discharge: 2022-01-10 | Disposition: A | Discharge status: Home / Self Care | Payer: BLUE CROSS/BLUE SHIELD | Source: Ambulatory Visit | Attending: Family Medicine | Admitting: Family Medicine

## 2022-01-10 ENCOUNTER — Other Ambulatory Visit: Payer: Self-pay

## 2022-01-10 DIAGNOSIS — Z1231 Encounter for screening mammogram for malignant neoplasm of breast: Secondary | ICD-10-CM | POA: Diagnosis not present

## 2023-03-27 ENCOUNTER — Other Ambulatory Visit (HOSPITAL_COMMUNITY): Payer: Self-pay | Admitting: Family Medicine

## 2023-03-27 DIAGNOSIS — Z1231 Encounter for screening mammogram for malignant neoplasm of breast: Secondary | ICD-10-CM

## 2023-04-10 ENCOUNTER — Encounter (HOSPITAL_COMMUNITY): Payer: Self-pay

## 2023-04-10 ENCOUNTER — Ambulatory Visit (HOSPITAL_COMMUNITY)
Admission: RE | Admit: 2023-04-10 | Discharge: 2023-04-10 | Disposition: A | Discharge status: Home / Self Care | Payer: 59 | Source: Ambulatory Visit | Attending: Family Medicine | Admitting: Family Medicine

## 2023-04-10 DIAGNOSIS — Z1231 Encounter for screening mammogram for malignant neoplasm of breast: Secondary | ICD-10-CM | POA: Insufficient documentation

## 2023-04-11 LAB — COLOGUARD

## 2023-06-10 LAB — COLOGUARD: COLOGUARD: NEGATIVE

## 2024-03-27 ENCOUNTER — Other Ambulatory Visit (HOSPITAL_COMMUNITY): Payer: Self-pay | Admitting: Family Medicine

## 2024-03-27 DIAGNOSIS — Z1231 Encounter for screening mammogram for malignant neoplasm of breast: Secondary | ICD-10-CM

## 2024-07-03 ENCOUNTER — Inpatient Hospital Stay (HOSPITAL_COMMUNITY): Admission: RE | Admit: 2024-07-03 | Source: Ambulatory Visit

## 2024-09-03 ENCOUNTER — Encounter: Payer: Self-pay | Admitting: Emergency Medicine

## 2024-09-03 ENCOUNTER — Ambulatory Visit
Admission: EM | Admit: 2024-09-03 | Discharge: 2024-09-03 | Disposition: A | Discharge status: Home / Self Care | Payer: Self-pay | Attending: Nurse Practitioner | Admitting: Nurse Practitioner

## 2024-09-03 DIAGNOSIS — H6992 Unspecified Eustachian tube disorder, left ear: Secondary | ICD-10-CM

## 2024-09-03 MED ORDER — CETIRIZINE-PSEUDOEPHEDRINE ER 5-120 MG PO TB12: 1.0000 | ORAL_TABLET | Freq: Every day | ORAL | 0 refills | Status: AC

## 2024-09-03 MED ORDER — FLUTICASONE PROPIONATE 50 MCG/ACT NA SUSP: 2.0000 | Freq: Every day | NASAL | 0 refills | Status: AC

## 2024-09-03 NOTE — ED Triage Notes (Signed)
 Left ear pain x 1 month.  States can't hear well out of that ear.

## 2024-09-03 NOTE — ED Provider Notes (Signed)
 [facility]    CSN: [id number] Arrival date & time: 09/03/24  1735      History   Chief Complaint No chief complaint on file.   HPI Virginia Juarez is a 48 y.o. female.   The history is provided by the patient.   Patient presents for complaints of left ear pain has been present for the past month.  Patient endorses muffled hearing, and intermittent pain.  She denies fever, chills, dizziness, lightheadedness, ear drainage, or recent upper respiratory symptoms.  So far, patient states she has not taken any medications for her symptoms.  Past Medical History:  Diagnosis Date   Anemia    Family history of adverse reaction to anesthesia    mother has nausea/vomiting   Headache    Migraines   PONV (postoperative nausea and vomiting)     There are no active problems to display for this patient.   Past Surgical History:  Procedure Laterality Date   DILATATION & CURETTAGE/HYSTEROSCOPY WITH MYOSURE N/A 08/03/2016   Procedure: DILATATION & CURETTAGE/HYSTEROSCOPY WITH MYOSURE;  Surgeon: Dickie Carder, MD;  Location: [facility];  Service: Gynecology;  Laterality: N/A;   ESSURE TUBAL LIGATION      OB History   No obstetric history on file.      Home Medications    Prior to Admission medications   Not on File    Family History History reviewed. No pertinent family history.  Social History Social History   Tobacco Use   Smoking status: Never   Smokeless tobacco: Never  Substance Use Topics   Alcohol use: Yes    Comment: occ   Drug use: No     Allergies   Patient has no known allergies.   Review of Systems Review of Systems Per HPI  Physical Exam Triage Vital Signs ED Triage Vitals  Encounter Vitals Group     BP 09/03/24 1748 (!) 157/91     Girls Systolic BP Percentile --      Girls Diastolic BP Percentile --      Boys Systolic BP Percentile --      Boys Diastolic BP Percentile --      Pulse Rate 09/03/24 1748 70     Resp 09/03/24  1748 18     Temp 09/03/24 1748 98.4 F (36.9 C)     Temp Source 09/03/24 1748 Oral     SpO2 09/03/24 1748 95 %     Weight --      Height --      Head Circumference --      Peak Flow --      Pain Score 09/03/24 1750 0     Pain Loc --      Pain Education --      Exclude from Growth Chart --    No data found.  Updated Vital Signs BP (!) 157/91 (BP Location: Right Arm)   Pulse 70   Temp 98.4 F (36.9 C) (Oral)   Resp 18   LMP 01/07/2022   SpO2 95%   Visual Acuity Right Eye Distance:   Left Eye Distance:   Bilateral Distance:    Right Eye Near:   Left Eye Near:    Bilateral Near:     Physical Exam Vitals and nursing note reviewed.  Constitutional:      General: She is not in acute distress.    Appearance: Normal appearance.  HENT:     Head: Normocephalic.     Right Ear: Hearing, tympanic  membrane, ear canal and external ear normal.     Left Ear: Ear canal and external ear normal. Decreased hearing noted. A middle ear effusion is present.     Nose: Nose normal.     Right Turbinates: Enlarged and swollen.     Left Turbinates: Enlarged and swollen.     Right Sinus: No maxillary sinus tenderness or frontal sinus tenderness.     Left Sinus: No maxillary sinus tenderness or frontal sinus tenderness.     Mouth/Throat:     Lips: Pink.     Mouth: Mucous membranes are moist.     Pharynx: Oropharynx is clear. Uvula midline.  Eyes:     Extraocular Movements: Extraocular movements intact.     Pupils: Pupils are equal, round, and reactive to light.  Pulmonary:     Effort: Pulmonary effort is normal.  Musculoskeletal:     Cervical back: Normal range of motion.  Skin:    General: Skin is warm and dry.  Neurological:     General: No focal deficit present.     Mental Status: She is alert and oriented to person, place, and time.  Psychiatric:        Mood and Affect: Mood normal.        Behavior: Behavior normal.      UC Treatments / Results  Labs (all labs ordered are  listed, but only abnormal results are displayed) Labs Reviewed - No data to display  EKG   Radiology No results found.  Procedures Procedures (including critical care time)  Medications Ordered in UC Medications - No data to display  Initial Impression / Assessment and Plan / UC Course  I have reviewed the triage vital signs and the nursing notes.  Pertinent labs & imaging results that were available during my care of the patient were reviewed by me and considered in my medical decision making (see chart for details).  On exam, patient with a left middle ear effusion.  TM is not erythematous at this time.  Will treat for left eustachian tube dysfunction with cetirizine D 5-120 mg and fluticasone 50 mcg nasal spray.  Supportive care recommendations were provided and discussed with the patient to include over-the-counter analgesics, and warm compresses to the ears.  Blood pressure was rechecked prior to discharge, BP at discharge was 153/83.  Review of the patient's blood pressure, no history of hypertension.  Proceed with current treatment recommendations.  Patient was given indications regarding when to discontinue medication if blood pressure remains elevated.  Discussed indications with the patient regarding follow-up.  Patient was in agreement with this plan of care and verbalizes understanding.  All questions were answered.  Patient stable for discharge.  Final Clinical Impressions(s) / UC Diagnoses   Final diagnoses:  None   Discharge Instructions   None    ED Prescriptions   None    PDMP not reviewed this encounter.   Gilmer Etta PARAS, NP 09/03/24 [phone]

## 2024-09-03 NOTE — Discharge Instructions (Addendum)
 Take medication as prescribed. You may take over-the-counter Tylenol as needed for pain, fever, or general discomfort. Apply warm compresses to the ear while symptoms persist as needed for pain or discomfort. Do not stick or insert anything inside of the ear while symptoms persist. Monitor your blood pressure while you are taking the medication.  If your blood pressure remains elevated, discontinue the medication immediately. If symptoms fail to improve with this treatment, you may follow-up in this clinic or with your primary care physician for further evaluation. Follow-up as needed.

## 2024-10-06 ENCOUNTER — Ambulatory Visit
Admission: EM | Admit: 2024-10-06 | Discharge: 2024-10-06 | Disposition: A | Discharge status: Home / Self Care | Payer: Self-pay | Attending: Family Medicine | Admitting: Family Medicine

## 2024-10-06 ENCOUNTER — Encounter: Payer: Self-pay | Admitting: Emergency Medicine

## 2024-10-06 DIAGNOSIS — J0100 Acute maxillary sinusitis, unspecified: Secondary | ICD-10-CM

## 2024-10-06 DIAGNOSIS — H6992 Unspecified Eustachian tube disorder, left ear: Secondary | ICD-10-CM

## 2024-10-06 MED ORDER — AZELASTINE HCL 0.1 % NA SOLN: 1.0000 | Freq: Two times a day (BID) | NASAL | 0 refills | Status: AC

## 2024-10-06 MED ORDER — AMOXICILLIN-POT CLAVULANATE 875-125 MG PO TABS: 1.0000 | ORAL_TABLET | Freq: Two times a day (BID) | ORAL | 0 refills | Status: AC

## 2024-10-06 NOTE — ED Triage Notes (Signed)
 Was seen on 11/18 for fluid in left ear.  Has been using nasal spray and zyrtec  without relief.  Continues to have pain in left ear.  Now has pressure in face and nasal congestion x 1 week.  States right side of face feels swollen.

## 2024-10-07 ENCOUNTER — Telehealth: Payer: Self-pay

## 2024-10-07 MED ORDER — FLUCONAZOLE 150 MG PO TABS: 150.0000 mg | ORAL_TABLET | Freq: Every day | ORAL | 0 refills | Status: AC

## 2024-10-07 NOTE — Telephone Encounter (Signed)
 Pt was seen yesterday and states she was given an antibiotic, would like a diflucan , to help with yeast symptoms if they develop   Please advise.

## 2024-10-07 NOTE — Telephone Encounter (Signed)
 Provider has given the okay to send in the Diflucan  that patient has requested.

## 2024-10-08 NOTE — ED Provider Notes (Signed)
 " [facility]    CSN: [id number] Arrival date & time: 10/06/24  1042      History   Chief Complaint No chief complaint on file.   HPI Virginia Juarez is a 48 y.o. female.   Patient presenting today with ongoing left ear pain and pressure, nasal congestion, and now worsening sinus pain and pressure, sinus headache, feeling the right side of face is swollen.  Denies fever, chills, chest pain, shortness of breath, abdominal pain, vomiting, or diarrhea.  So far trying Zyrtec  and Flonase  with minimal relief.    Past Medical History:  Diagnosis Date   Anemia    Family history of adverse reaction to anesthesia    mother has nausea/vomiting   Headache    Migraines   PONV (postoperative nausea and vomiting)     There are no active problems to display for this patient.   Past Surgical History:  Procedure Laterality Date   DILATATION & CURETTAGE/HYSTEROSCOPY WITH MYOSURE N/A 08/03/2016   Procedure: DILATATION & CURETTAGE/HYSTEROSCOPY WITH MYOSURE;  Surgeon: Dickie Carder, MD;  Location: [facility];  Service: Gynecology;  Laterality: N/A;   ESSURE TUBAL LIGATION      OB History   No obstetric history on file.      Home Medications    Prior to Admission medications  Medication Sig Start Date End Date Taking? Authorizing Provider  amoxicillin -clavulanate (AUGMENTIN ) 875-125 MG tablet Take 1 tablet by mouth every 12 (twelve) hours. 10/06/24  Yes Stuart Vernell Norris, PA-C  azelastine  (ASTELIN ) 0.1 % nasal spray Place 1 spray into both nostrils 2 (two) times daily. Use in each nostril as directed 10/06/24  Yes Stuart Vernell Norris, PA-C  cetirizine -pseudoephedrine  (ZYRTEC -D) 5-120 MG tablet Take 1 tablet by mouth daily. 09/03/24   Leath-Warren, Etta PARAS, NP  fluconazole  (DIFLUCAN ) 150 MG tablet Take 1 tablet (150 mg total) by mouth daily. 10/07/24   Stuart Vernell Norris, PA-C  fluticasone  (FLONASE ) 50 MCG/ACT nasal spray Place 2 sprays into both nostrils  daily. 09/03/24   Leath-Warren, Etta PARAS, NP    Family History History reviewed. No pertinent family history.  Social History Social History[1]   Allergies   Patient has no known allergies.   Review of Systems Review of Systems PER HPI  Physical Exam Triage Vital Signs ED Triage Vitals  Encounter Vitals Group     BP 10/06/24 1131 134/80     Girls Systolic BP Percentile --      Girls Diastolic BP Percentile --      Boys Systolic BP Percentile --      Boys Diastolic BP Percentile --      Pulse Rate 10/06/24 1131 76     Resp 10/06/24 1131 18     Temp 10/06/24 1131 98.4 F (36.9 C)     Temp Source 10/06/24 1131 Oral     SpO2 10/06/24 1131 99 %     Weight --      Height --      Head Circumference --      Peak Flow --      Pain Score 10/06/24 1132 3     Pain Loc --      Pain Education --      Exclude from Growth Chart --    No data found.  Updated Vital Signs BP 134/80 (BP Location: Right Arm)   Pulse 76   Temp 98.4 F (36.9 C) (Oral)   Resp 18   LMP 01/07/2022   SpO2 99%  Visual Acuity Right Eye Distance:   Left Eye Distance:   Bilateral Distance:    Right Eye Near:   Left Eye Near:    Bilateral Near:     Physical Exam Vitals and nursing note reviewed.  Constitutional:      Appearance: Normal appearance.  HENT:     Head: Atraumatic.     Right Ear: Tympanic membrane and external ear normal.     Left Ear: External ear normal.     Ears:     Comments: Left middle ear effusion    Nose: Congestion present.     Mouth/Throat:     Mouth: Mucous membranes are moist.     Pharynx: Posterior oropharyngeal erythema present.  Eyes:     Extraocular Movements: Extraocular movements intact.     Conjunctiva/sclera: Conjunctivae normal.  Cardiovascular:     Rate and Rhythm: Normal rate and regular rhythm.     Heart sounds: Normal heart sounds.  Pulmonary:     Effort: Pulmonary effort is normal.     Breath sounds: Normal breath sounds. No wheezing or  rales.  Musculoskeletal:        General: Normal range of motion.     Cervical back: Normal range of motion and neck supple.  Skin:    General: Skin is warm and dry.  Neurological:     Mental Status: She is alert and oriented to person, place, and time.  Psychiatric:        Mood and Affect: Mood normal.        Thought Content: Thought content normal.    UC Treatments / Results  Labs (all labs ordered are listed, but only abnormal results are displayed) Labs Reviewed - No data to display  EKG   Radiology No results found.  Procedures Procedures (including critical care time)  Medications Ordered in UC Medications - No data to display  Initial Impression / Assessment and Plan / UC Course  I have reviewed the triage vital signs and the nursing notes.  Pertinent labs & imaging results that were available during my care of the patient were reviewed by me and considered in my medical decision making (see chart for details).     Given duration and worsening course of symptoms, will start Augmentin , add Astelin  for the left middle ear effusion, discussed supportive over-the-counter medications and home care.  Return for worsening or unresolving symptoms.  Final Clinical Impressions(s) / UC Diagnoses   Final diagnoses:  Acute non-recurrent maxillary sinusitis  Acute dysfunction of left eustachian tube   Discharge Instructions   None    ED Prescriptions     Medication Sig Dispense Auth. Provider   amoxicillin -clavulanate (AUGMENTIN ) 875-125 MG tablet Take 1 tablet by mouth every 12 (twelve) hours. 14 tablet Stuart Vernell Norris, PA-C   azelastine  (ASTELIN ) 0.1 % nasal spray Place 1 spray into both nostrils 2 (two) times daily. Use in each nostril as directed 30 mL Stuart Vernell Norris, PA-C      PDMP not reviewed this encounter.    [1]  Social History Tobacco Use   Smoking status: Never   Smokeless tobacco: Never  Substance Use Topics   Alcohol use: Yes     Comment: occ   Drug use: No     Stuart Vernell Norris, PA-C 10/08/24 1125  "
